# Patient Record
Sex: Male | Born: 1965 | Race: White | Hispanic: No | Marital: Married
Health system: Southern US, Community
[De-identification: ages and names within clinical notes are randomized; demographics above are authoritative.]

---

## 1998-07-06 ENCOUNTER — Encounter: Payer: Self-pay | Admitting: Emergency Medicine

## 1998-07-06 ENCOUNTER — Observation Stay (HOSPITAL_COMMUNITY): Admission: EM | Admit: 1998-07-06 | Discharge: 1998-07-07 | Payer: Self-pay | Admitting: Emergency Medicine

## 2000-03-27 ENCOUNTER — Other Ambulatory Visit: Admission: RE | Admit: 2000-03-27 | Discharge: 2000-03-27 | Payer: Self-pay | Admitting: Urology

## 2000-03-27 ENCOUNTER — Encounter (INDEPENDENT_AMBULATORY_CARE_PROVIDER_SITE_OTHER): Payer: Self-pay | Admitting: Specialist

## 2007-05-25 ENCOUNTER — Ambulatory Visit (HOSPITAL_COMMUNITY): Admission: RE | Admit: 2007-05-25 | Discharge: 2007-05-26 | Payer: Self-pay | Admitting: Specialist

## 2008-01-11 ENCOUNTER — Emergency Department (HOSPITAL_COMMUNITY): Admission: EM | Admit: 2008-01-11 | Discharge: 2008-01-11 | Payer: Self-pay | Admitting: Emergency Medicine

## 2008-04-08 IMAGING — CR DG SHOULDER 2+V*L*
3 series · 3 of 3 positions shown · non-contrast
Comparison: None.

CLINICAL DATA: Left clavicle fracture/nonunion. 
 LEFT SHOULDER ? 3 VIEW:

[w shoulder ap internal left]
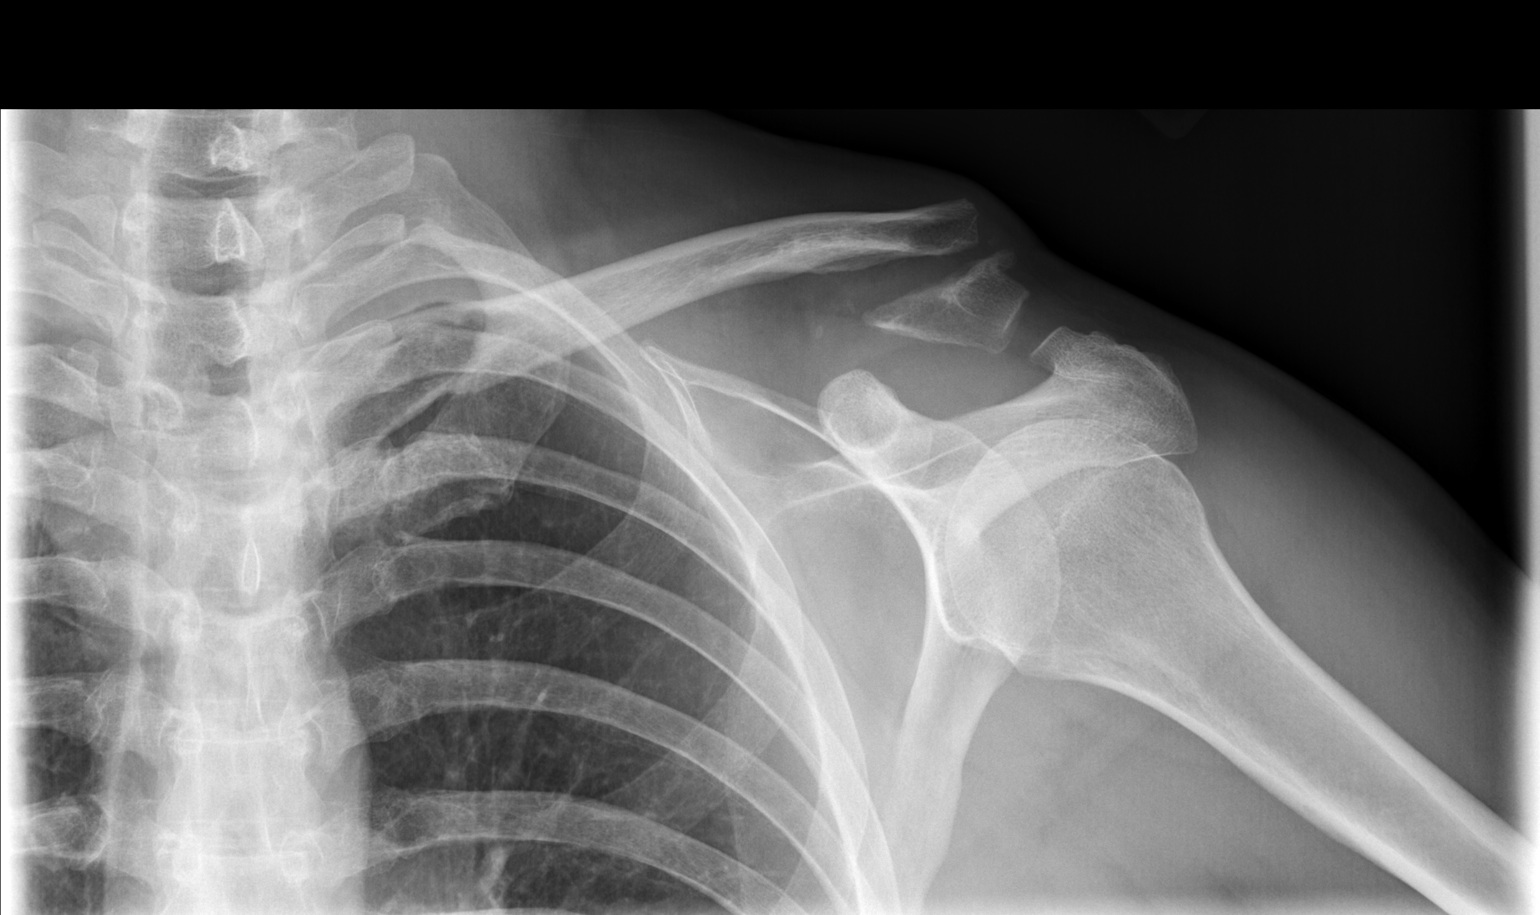

[w shoulder ap external left]
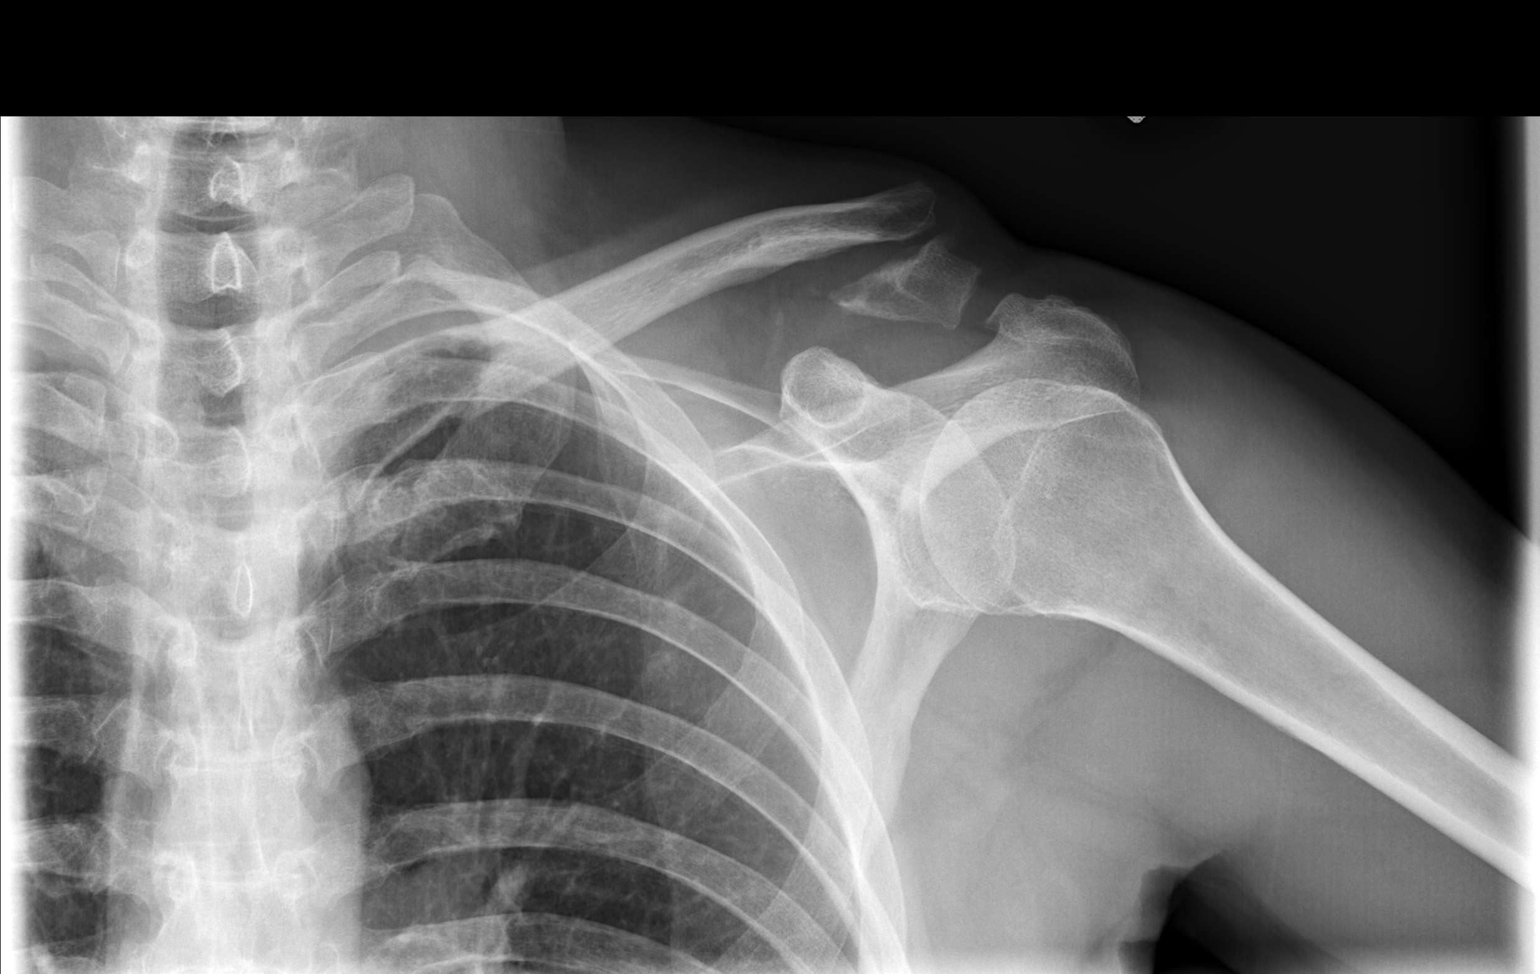

[w shoulder y view left]
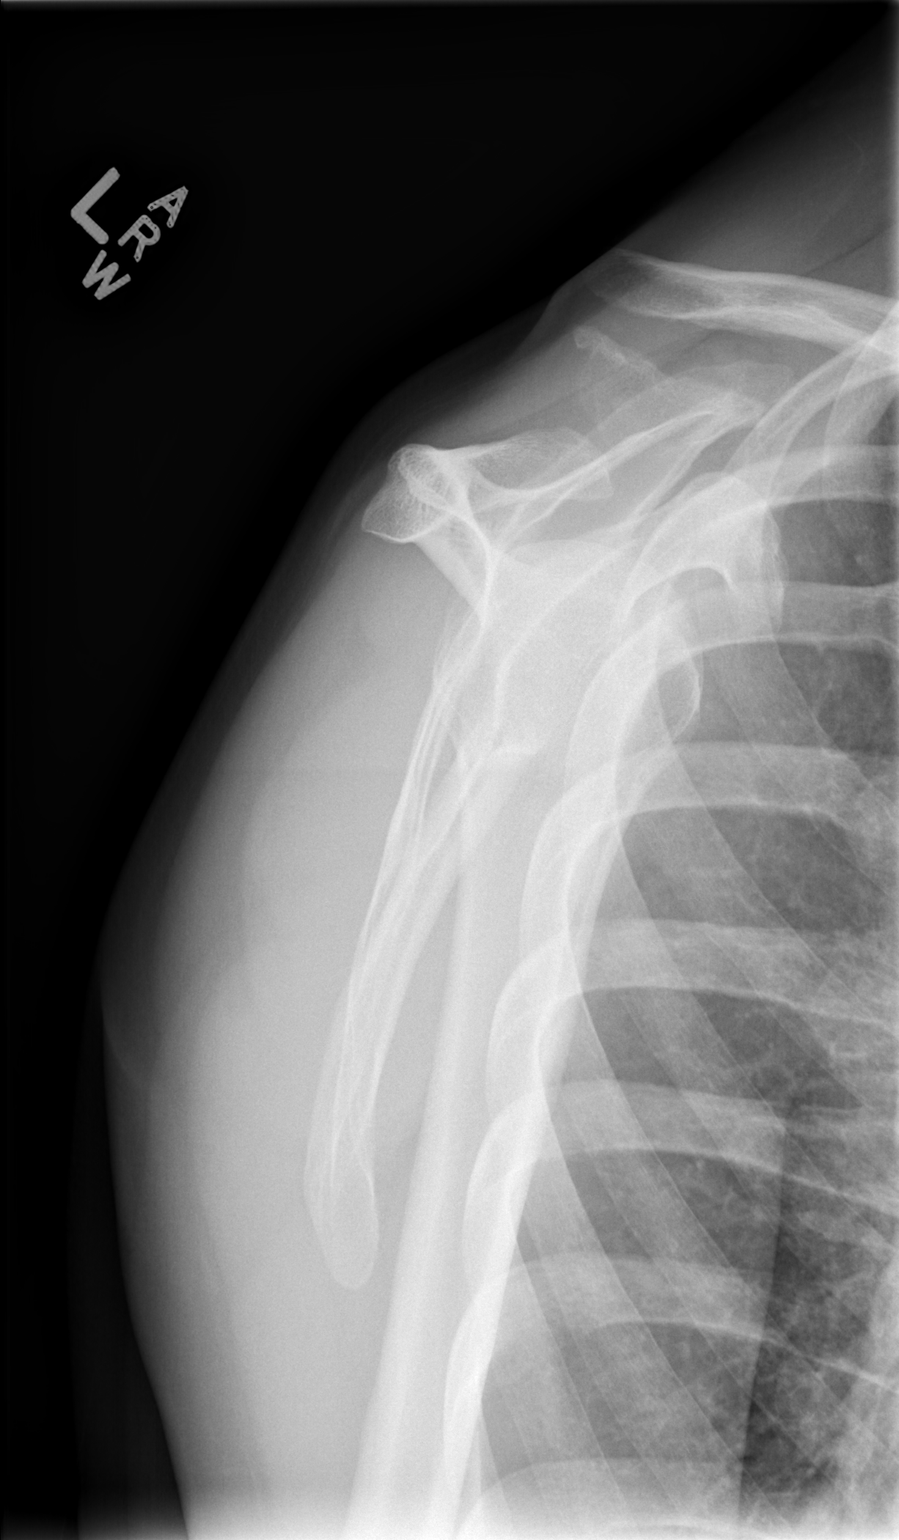

[3 of 3 positions shown; findings below may reference images not displayed]

FINDINGS: There is a fracture of the distal left clavicle associated with 1.9 cm of inferior displacement.  There is also medial displacement.  The acromioclavicular joint appears intact, although is mildly widened.  The acromion and coracoid process are intact.  The degree of displacement implies a tear of the coracoclavicular ligament, and small ossific densities are noted in that location.  No rib fracture is seen.
IMPRESSION: Displaced fracture of the distal clavicle as described.  The fracture margins are somewhat ill defined and this may reflect an old injury with nonunion as per given history.

## 2008-05-09 ENCOUNTER — Ambulatory Visit (HOSPITAL_BASED_OUTPATIENT_CLINIC_OR_DEPARTMENT_OTHER): Admission: RE | Admit: 2008-05-09 | Discharge: 2008-05-09 | Payer: Self-pay | Admitting: Specialist

## 2010-05-09 ENCOUNTER — Encounter: Payer: Self-pay | Admitting: Specialist

## 2010-08-02 LAB — POCT I-STAT 4, (NA,K, GLUC, HGB,HCT)
Glucose, Bld: 94 mg/dL (ref 70–99)
HCT: 45 % (ref 39.0–52.0)
Hemoglobin: 15.3 g/dL (ref 13.0–17.0)
Potassium: 4.5 mEq/L (ref 3.5–5.1)
Sodium: 142 mEq/L (ref 135–145)

## 2010-08-31 NOTE — Op Note (Signed)
Aaron Ponce, Aaron Ponce NO.:  [id number]   MEDICAL RECORD NO.:  [id number]          PATIENT TYPE:  OIB   LOCATION:  1612                         FACILITY:  [facility]   PHYSICIAN:  Jene Every, M.D.    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   PROCEDURE:  1. Takedown of nonunion of distal clavicle fracture.  2. Excision of the distal clavicle.  3. Reduction of the clavicle and repair and reconstruction of the      coracoclavicular ligaments utilizing the suture scrap for autograft      bone grafting of the distal clavicle.   ANESTHESIA:  General.   ASSISTANT:  Almedia Balls. Ranell Patrick, M.D.   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  He had delayed surgical intervention.  He had a  hypermobile distal clavicle and was unstable, and it was thought that  open reduction and internal fixation and possible reconstruction of the  coracoclavicular ligaments and allograft bone grafting would be  required.  The risks and benefits discussed, including bleeding,  infection, damage to neurovascular structures, suboptimal range of  motion, recurrent separation, nonunion, malunion, DVT, PE, anesthetic  complications, etc.   TECHNIQUE:  With the patient in supine beach chair position and after  induction of adequate general anesthesia and 1 gram Kefzol, the left  shoulder and upper extremity was prepped and draped in the usual sterile  fashion.   A saber-type incision was made over the anterior aspect of the distal  left clavicle.  Subcutaneous  tissue dissected.  Electrocautery utilized  to achieve hemostasis.  The deltotrapezial fascia was identified and  divided and subperiosteally elevated from the distal clavicle,  identifying the nonunion.  Fibrous tissue was then debrided from the  area of the nonunion over the distal clavicle.  The fragment over the  distal clavicle was displaced.  It was skeletonized and felt to be  hypermobile and not amenable to surgical fixation.  This was  skeletonized and removed with a rongeur and morselized for later  autografting.  The distal clavicle was found to be hypermobile as well.  There was a fragment of bone that was pulled from the distal clavicle  that had the coracoclavicular ligament attached to it.  We felt that  this would allow for reconstruction and stabilization of the distal  clavicle.  The deltotrapezial fascia was subperiosteally elevated over  the anterior aspect of the clavicle.  We then split that and identified  with digital palpation the coracoid.  The coracoid on top of the  coracoid was identified.  We placed FiberWire sutures through the  coracoclavicular ligament and the fragment of bone that had pulled off  the clavicle.  This was saved  for further reconstruction.  We then used  a drill, and we drilled through the clavicle and the coracoid.  We  passed a suture button through the two out from underneath the inferior  aspect of the coracoid.  Soft tissues beneath that were protected at all  times with a curved Crego.  This wire was then passed, and then the  sutures on the EndoButton were then passed through the clavicle and  through the coracoid.  The button was then inserted on the inferior  aspect of the coracoid, holding it into place.  Excellent fixation was  noted here.  We passed this EndoButton up through the clavicle.  We then  placed the drill holes over the distal clavicle and brought two suture  arms from the coracoclavicular ligament and its fragment of  bone.  We  then reduced the distal clavicle with the EndoButton and tied it a  surgeon's knot on top of it, with excellent reduction and collinear with  the acromion noted.  I then threaded the suture ends of the  coracoclavicular ligaments with the bone through the end of the distal  clavicle after it was prepared.  We decorticated the undersurface of the  clavicle, rongeured that so this bone would be delivered to that portion  of the clavicle with bone-on-bone contact.  This was tied on top with  surgeon's knots.  We had excellent purchase.  We then used autograft  bone graft and bone grafted around the fragment of bone to aid in  ossification of the coracoclavicular ligaments to the clavicle.  Prior  to this, we copiously irrigated with irrigation.  This was performed  under x-ray.  This was found to stabilized the distal clavicle.  We then  repaired the deltotrapezial fascia with #1 Vicryl interrupted figure-of-  eight sutures with the deltoid over top of the clavicle.  Subcutaneous  tissue reapproximated with 2-0 Vicryl.  The skin was reapproximated with  staples.  We felt there was excellent fixation of the distal clavicle  noted.  The final picture revealed that with placement of the  EndoButton.   A sterile dressing was applied.  He was placed in a sling, extubated  without difficulty, and transported to the recovery room in satisfactory  condition.  The patient tolerated the procedure well, and there were no  complications.      Jene Every, M.D.  Electronically Signed     JB/MEDQ  D:  05/25/2007  T:  05/27/2007  Job:  [id number]
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
## 2011-01-07 LAB — URINALYSIS, ROUTINE W REFLEX MICROSCOPIC
Glucose, UA: NEGATIVE
Hgb urine dipstick: NEGATIVE
Ketones, ur: NEGATIVE
Protein, ur: NEGATIVE
Urobilinogen, UA: 0.2

## 2011-01-07 LAB — PROTIME-INR
INR: 0.9
Prothrombin Time: 12.5

## 2011-01-07 LAB — BASIC METABOLIC PANEL
BUN: 11
Calcium: 9.1
Creatinine, Ser: 0.86
GFR calc non Af Amer: >60
Glucose, Bld: 90
Potassium: 4.6

## 2011-01-07 LAB — CBC
HCT: 41.7
Platelets: 232
RDW: 13.7
WBC: 5.9

## 2011-01-07 LAB — DIFFERENTIAL
Basophils Absolute: 0.1
Eosinophils Relative: 9 — ABNORMAL HIGH
Lymphocytes Relative: 45
Neutrophils Relative %: 39 — ABNORMAL LOW

## 2017-03-13 ENCOUNTER — Encounter: Payer: BLUE CROSS/BLUE SHIELD | Admitting: Podiatry

## 2017-03-15 ENCOUNTER — Ambulatory Visit: Payer: BLUE CROSS/BLUE SHIELD | Admitting: Podiatry

## 2017-03-17 NOTE — Progress Notes (Signed)
This encounter was created in error - please disregard.

## 2017-03-27 ENCOUNTER — Ambulatory Visit: Payer: BLUE CROSS/BLUE SHIELD | Admitting: Podiatry

## 2017-04-04 DIAGNOSIS — F5221 Male erectile disorder: Secondary | ICD-10-CM | POA: Diagnosis not present

## 2017-04-04 DIAGNOSIS — Z125 Encounter for screening for malignant neoplasm of prostate: Secondary | ICD-10-CM | POA: Diagnosis not present

## 2017-07-07 DIAGNOSIS — E782 Mixed hyperlipidemia: Secondary | ICD-10-CM | POA: Diagnosis not present

## 2017-07-07 DIAGNOSIS — M62830 Muscle spasm of back: Secondary | ICD-10-CM | POA: Diagnosis not present

## 2017-07-07 DIAGNOSIS — Z79899 Other long term (current) drug therapy: Secondary | ICD-10-CM | POA: Diagnosis not present

## 2017-07-07 DIAGNOSIS — F319 Bipolar disorder, unspecified: Secondary | ICD-10-CM | POA: Diagnosis not present

## 2017-09-19 DIAGNOSIS — L309 Dermatitis, unspecified: Secondary | ICD-10-CM | POA: Diagnosis not present

## 2017-09-22 DIAGNOSIS — B029 Zoster without complications: Secondary | ICD-10-CM | POA: Diagnosis not present

## 2018-02-05 DIAGNOSIS — F319 Bipolar disorder, unspecified: Secondary | ICD-10-CM | POA: Diagnosis not present

## 2018-02-05 DIAGNOSIS — M79671 Pain in right foot: Secondary | ICD-10-CM | POA: Diagnosis not present

## 2018-02-05 DIAGNOSIS — M25522 Pain in left elbow: Secondary | ICD-10-CM | POA: Diagnosis not present

## 2018-02-05 DIAGNOSIS — E782 Mixed hyperlipidemia: Secondary | ICD-10-CM | POA: Diagnosis not present

## 2019-08-13 DIAGNOSIS — Z125 Encounter for screening for malignant neoplasm of prostate: Secondary | ICD-10-CM | POA: Diagnosis not present

## 2019-08-19 DIAGNOSIS — N5201 Erectile dysfunction due to arterial insufficiency: Secondary | ICD-10-CM | POA: Diagnosis not present

## 2020-01-03 DIAGNOSIS — R002 Palpitations: Secondary | ICD-10-CM | POA: Diagnosis not present

## 2020-01-03 DIAGNOSIS — R202 Paresthesia of skin: Secondary | ICD-10-CM | POA: Diagnosis not present

## 2020-01-06 DIAGNOSIS — F319 Bipolar disorder, unspecified: Secondary | ICD-10-CM | POA: Diagnosis not present

## 2020-01-06 DIAGNOSIS — R0789 Other chest pain: Secondary | ICD-10-CM | POA: Diagnosis not present

## 2020-01-06 DIAGNOSIS — E782 Mixed hyperlipidemia: Secondary | ICD-10-CM | POA: Diagnosis not present

## 2020-01-06 DIAGNOSIS — R030 Elevated blood-pressure reading, without diagnosis of hypertension: Secondary | ICD-10-CM | POA: Diagnosis not present

## 2020-02-03 DIAGNOSIS — R7989 Other specified abnormal findings of blood chemistry: Secondary | ICD-10-CM | POA: Diagnosis not present

## 2020-02-03 DIAGNOSIS — R61 Generalized hyperhidrosis: Secondary | ICD-10-CM | POA: Diagnosis not present

## 2020-02-03 DIAGNOSIS — R946 Abnormal results of thyroid function studies: Secondary | ICD-10-CM | POA: Diagnosis not present

## 2020-02-03 DIAGNOSIS — R7401 Elevation of levels of liver transaminase levels: Secondary | ICD-10-CM | POA: Diagnosis not present

## 2020-02-03 DIAGNOSIS — E782 Mixed hyperlipidemia: Secondary | ICD-10-CM | POA: Diagnosis not present

## 2020-02-03 DIAGNOSIS — E039 Hypothyroidism, unspecified: Secondary | ICD-10-CM | POA: Diagnosis not present

## 2020-03-05 DIAGNOSIS — F339 Major depressive disorder, recurrent, unspecified: Secondary | ICD-10-CM | POA: Diagnosis not present

## 2020-03-05 DIAGNOSIS — F1010 Alcohol abuse, uncomplicated: Secondary | ICD-10-CM | POA: Diagnosis not present

## 2020-03-09 DIAGNOSIS — E039 Hypothyroidism, unspecified: Secondary | ICD-10-CM | POA: Diagnosis not present

## 2020-03-20 DIAGNOSIS — F1010 Alcohol abuse, uncomplicated: Secondary | ICD-10-CM | POA: Diagnosis not present

## 2020-03-20 DIAGNOSIS — F339 Major depressive disorder, recurrent, unspecified: Secondary | ICD-10-CM | POA: Diagnosis not present

## 2020-04-03 DIAGNOSIS — F339 Major depressive disorder, recurrent, unspecified: Secondary | ICD-10-CM | POA: Diagnosis not present

## 2020-04-03 DIAGNOSIS — F1010 Alcohol abuse, uncomplicated: Secondary | ICD-10-CM | POA: Diagnosis not present

## 2020-05-04 DIAGNOSIS — F339 Major depressive disorder, recurrent, unspecified: Secondary | ICD-10-CM | POA: Diagnosis not present

## 2020-05-04 DIAGNOSIS — F1010 Alcohol abuse, uncomplicated: Secondary | ICD-10-CM | POA: Diagnosis not present

## 2020-06-09 DIAGNOSIS — F1010 Alcohol abuse, uncomplicated: Secondary | ICD-10-CM | POA: Diagnosis not present

## 2020-06-09 DIAGNOSIS — F339 Major depressive disorder, recurrent, unspecified: Secondary | ICD-10-CM | POA: Diagnosis not present

## 2020-07-09 DIAGNOSIS — F339 Major depressive disorder, recurrent, unspecified: Secondary | ICD-10-CM | POA: Diagnosis not present

## 2020-07-09 DIAGNOSIS — F1010 Alcohol abuse, uncomplicated: Secondary | ICD-10-CM | POA: Diagnosis not present

## 2020-08-20 DIAGNOSIS — F1010 Alcohol abuse, uncomplicated: Secondary | ICD-10-CM | POA: Diagnosis not present

## 2020-08-20 DIAGNOSIS — F339 Major depressive disorder, recurrent, unspecified: Secondary | ICD-10-CM | POA: Diagnosis not present

## 2020-10-29 DIAGNOSIS — Z5181 Encounter for therapeutic drug level monitoring: Secondary | ICD-10-CM | POA: Diagnosis not present

## 2020-10-29 DIAGNOSIS — F319 Bipolar disorder, unspecified: Secondary | ICD-10-CM | POA: Diagnosis not present

## 2020-10-29 DIAGNOSIS — R5383 Other fatigue: Secondary | ICD-10-CM | POA: Diagnosis not present

## 2020-10-29 DIAGNOSIS — E782 Mixed hyperlipidemia: Secondary | ICD-10-CM | POA: Diagnosis not present

## 2020-10-29 DIAGNOSIS — E039 Hypothyroidism, unspecified: Secondary | ICD-10-CM | POA: Diagnosis not present

## 2020-10-29 DIAGNOSIS — Z20822 Contact with and (suspected) exposure to covid-19: Secondary | ICD-10-CM | POA: Diagnosis not present

## 2020-10-29 DIAGNOSIS — R197 Diarrhea, unspecified: Secondary | ICD-10-CM | POA: Diagnosis not present

## 2020-10-29 DIAGNOSIS — R112 Nausea with vomiting, unspecified: Secondary | ICD-10-CM | POA: Diagnosis not present

## 2020-11-19 DIAGNOSIS — M65321 Trigger finger, right index finger: Secondary | ICD-10-CM | POA: Diagnosis not present

## 2020-11-19 DIAGNOSIS — M1811 Unilateral primary osteoarthritis of first carpometacarpal joint, right hand: Secondary | ICD-10-CM | POA: Diagnosis not present

## 2020-11-19 DIAGNOSIS — M24041 Loose body in right finger joint(s): Secondary | ICD-10-CM | POA: Diagnosis not present

## 2020-11-19 DIAGNOSIS — G7080 Lambert-Eaton syndrome, unspecified: Secondary | ICD-10-CM | POA: Diagnosis not present

## 2020-12-28 DIAGNOSIS — F1010 Alcohol abuse, uncomplicated: Secondary | ICD-10-CM | POA: Diagnosis not present

## 2020-12-28 DIAGNOSIS — F339 Major depressive disorder, recurrent, unspecified: Secondary | ICD-10-CM | POA: Diagnosis not present

## 2021-01-01 DIAGNOSIS — R42 Dizziness and giddiness: Secondary | ICD-10-CM | POA: Diagnosis not present

## 2021-01-01 DIAGNOSIS — E039 Hypothyroidism, unspecified: Secondary | ICD-10-CM | POA: Diagnosis not present

## 2021-01-22 DIAGNOSIS — F4321 Adjustment disorder with depressed mood: Secondary | ICD-10-CM | POA: Diagnosis not present

## 2021-02-12 DIAGNOSIS — F4321 Adjustment disorder with depressed mood: Secondary | ICD-10-CM | POA: Diagnosis not present

## 2021-02-26 DIAGNOSIS — F4321 Adjustment disorder with depressed mood: Secondary | ICD-10-CM | POA: Diagnosis not present

## 2021-03-22 DIAGNOSIS — F4321 Adjustment disorder with depressed mood: Secondary | ICD-10-CM | POA: Diagnosis not present

## 2021-04-05 DIAGNOSIS — F4321 Adjustment disorder with depressed mood: Secondary | ICD-10-CM | POA: Diagnosis not present

## 2021-04-26 DIAGNOSIS — F4321 Adjustment disorder with depressed mood: Secondary | ICD-10-CM | POA: Diagnosis not present

## 2021-05-06 DIAGNOSIS — M65321 Trigger finger, right index finger: Secondary | ICD-10-CM | POA: Diagnosis not present

## 2021-06-04 DIAGNOSIS — F4321 Adjustment disorder with depressed mood: Secondary | ICD-10-CM | POA: Diagnosis not present

## 2021-06-18 DIAGNOSIS — F4321 Adjustment disorder with depressed mood: Secondary | ICD-10-CM | POA: Diagnosis not present

## 2021-06-21 DIAGNOSIS — F319 Bipolar disorder, unspecified: Secondary | ICD-10-CM | POA: Diagnosis not present

## 2021-06-21 DIAGNOSIS — E039 Hypothyroidism, unspecified: Secondary | ICD-10-CM | POA: Diagnosis not present

## 2021-06-21 DIAGNOSIS — M79671 Pain in right foot: Secondary | ICD-10-CM | POA: Diagnosis not present

## 2021-06-21 DIAGNOSIS — E782 Mixed hyperlipidemia: Secondary | ICD-10-CM | POA: Diagnosis not present

## 2021-06-22 ENCOUNTER — Other Ambulatory Visit: Payer: Self-pay | Admitting: Family Medicine

## 2021-06-22 ENCOUNTER — Ambulatory Visit
Admission: RE | Admit: 2021-06-22 | Discharge: 2021-06-22 | Disposition: A | Discharge status: Home / Self Care | Payer: BC Managed Care – PPO | Source: Ambulatory Visit | Attending: Family Medicine | Admitting: Family Medicine

## 2021-06-22 DIAGNOSIS — M79671 Pain in right foot: Secondary | ICD-10-CM

## 2021-06-22 DIAGNOSIS — M7989 Other specified soft tissue disorders: Secondary | ICD-10-CM | POA: Diagnosis not present

## 2021-07-05 DIAGNOSIS — F4321 Adjustment disorder with depressed mood: Secondary | ICD-10-CM | POA: Diagnosis not present

## 2021-07-15 DIAGNOSIS — Z20822 Contact with and (suspected) exposure to covid-19: Secondary | ICD-10-CM | POA: Diagnosis not present

## 2021-07-15 DIAGNOSIS — E039 Hypothyroidism, unspecified: Secondary | ICD-10-CM | POA: Diagnosis not present

## 2021-07-15 DIAGNOSIS — R110 Nausea: Secondary | ICD-10-CM | POA: Diagnosis not present

## 2021-07-15 DIAGNOSIS — R42 Dizziness and giddiness: Secondary | ICD-10-CM | POA: Diagnosis not present

## 2021-07-15 DIAGNOSIS — F319 Bipolar disorder, unspecified: Secondary | ICD-10-CM | POA: Diagnosis not present

## 2021-07-15 DIAGNOSIS — R197 Diarrhea, unspecified: Secondary | ICD-10-CM | POA: Diagnosis not present

## 2021-10-14 DIAGNOSIS — H938X2 Other specified disorders of left ear: Secondary | ICD-10-CM | POA: Diagnosis not present

## 2022-02-14 DIAGNOSIS — E782 Mixed hyperlipidemia: Secondary | ICD-10-CM | POA: Diagnosis not present

## 2022-02-14 DIAGNOSIS — Z0000 Encounter for general adult medical examination without abnormal findings: Secondary | ICD-10-CM | POA: Diagnosis not present

## 2022-02-14 DIAGNOSIS — Z125 Encounter for screening for malignant neoplasm of prostate: Secondary | ICD-10-CM | POA: Diagnosis not present

## 2022-02-14 DIAGNOSIS — E039 Hypothyroidism, unspecified: Secondary | ICD-10-CM | POA: Diagnosis not present

## 2022-02-16 DIAGNOSIS — F3132 Bipolar disorder, current episode depressed, moderate: Secondary | ICD-10-CM | POA: Diagnosis not present

## 2022-02-16 DIAGNOSIS — F3181 Bipolar II disorder: Secondary | ICD-10-CM | POA: Diagnosis not present

## 2022-03-03 DIAGNOSIS — R945 Abnormal results of liver function studies: Secondary | ICD-10-CM | POA: Diagnosis not present

## 2022-03-03 DIAGNOSIS — R748 Abnormal levels of other serum enzymes: Secondary | ICD-10-CM | POA: Diagnosis not present

## 2022-05-07 IMAGING — CR DG FOOT COMPLETE 3+V*R*
3 series · 3 of 3 positions shown · non-contrast
Comparison: No prior.

CLINICAL DATA: Right foot pain.  Soft tissue swelling.

EXAM:
RIGHT FOOT COMPLETE - 3+ VIEW

[x foot ap right]
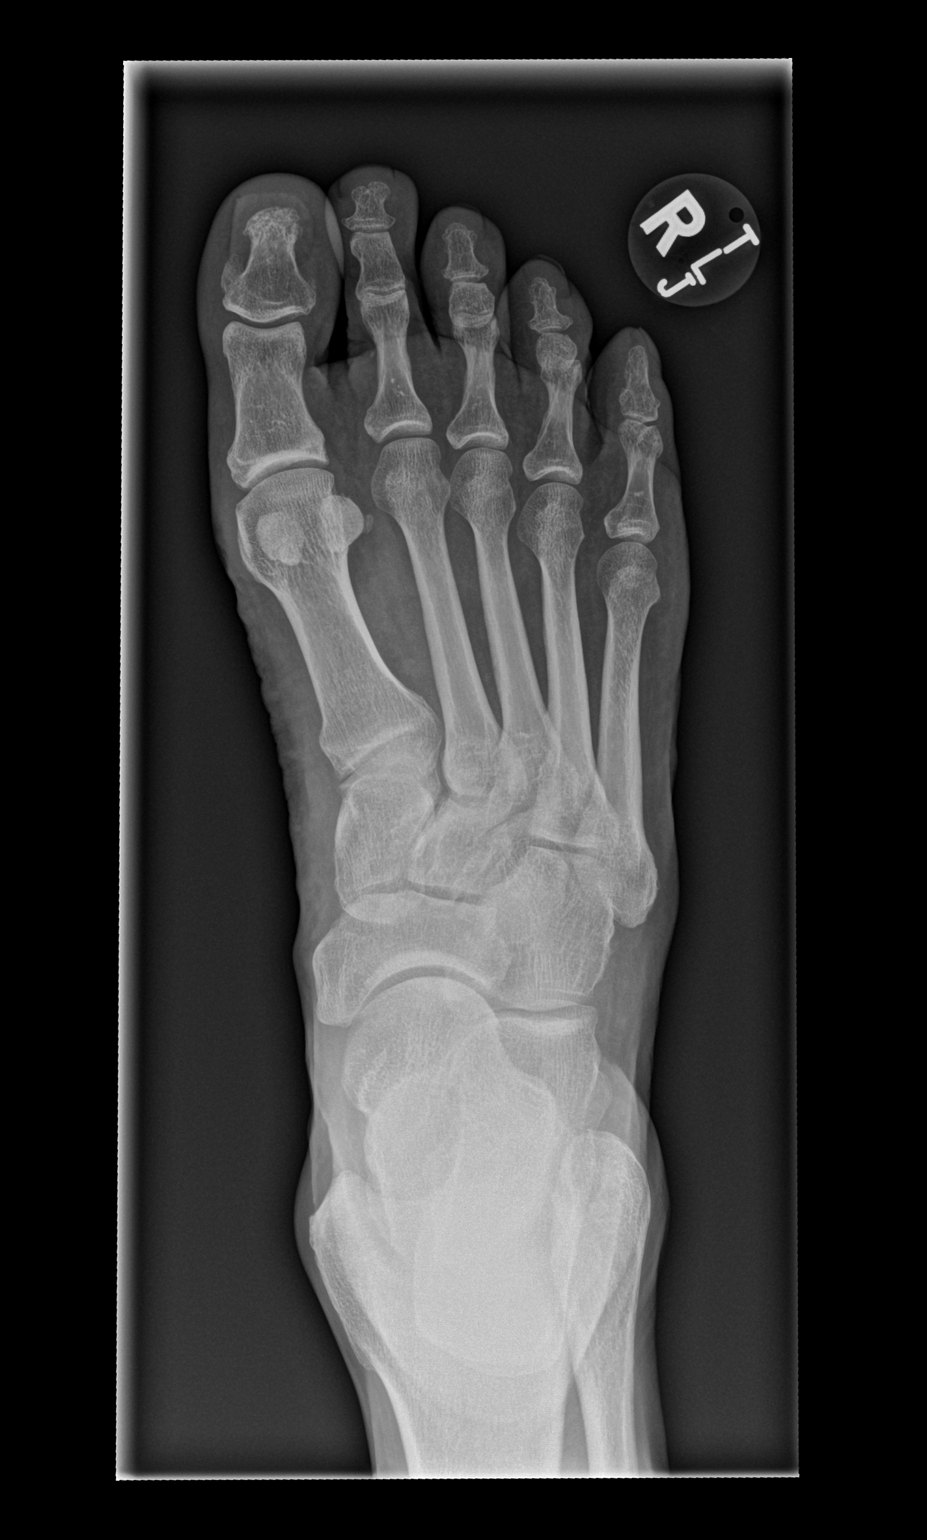

[x foot obl right]
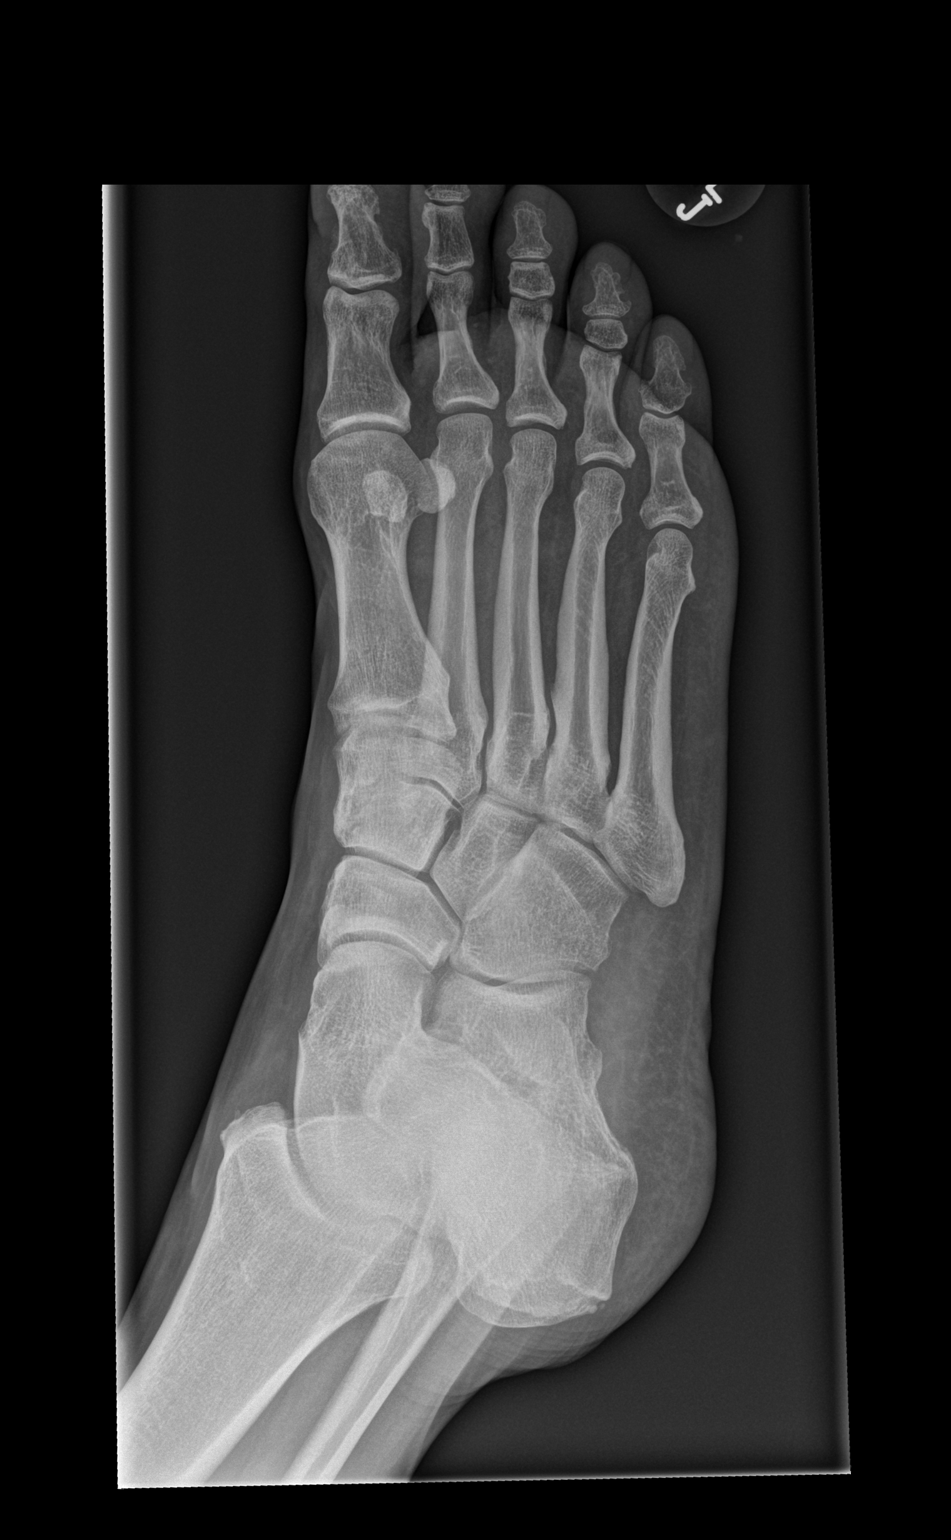

[x foot lat right]
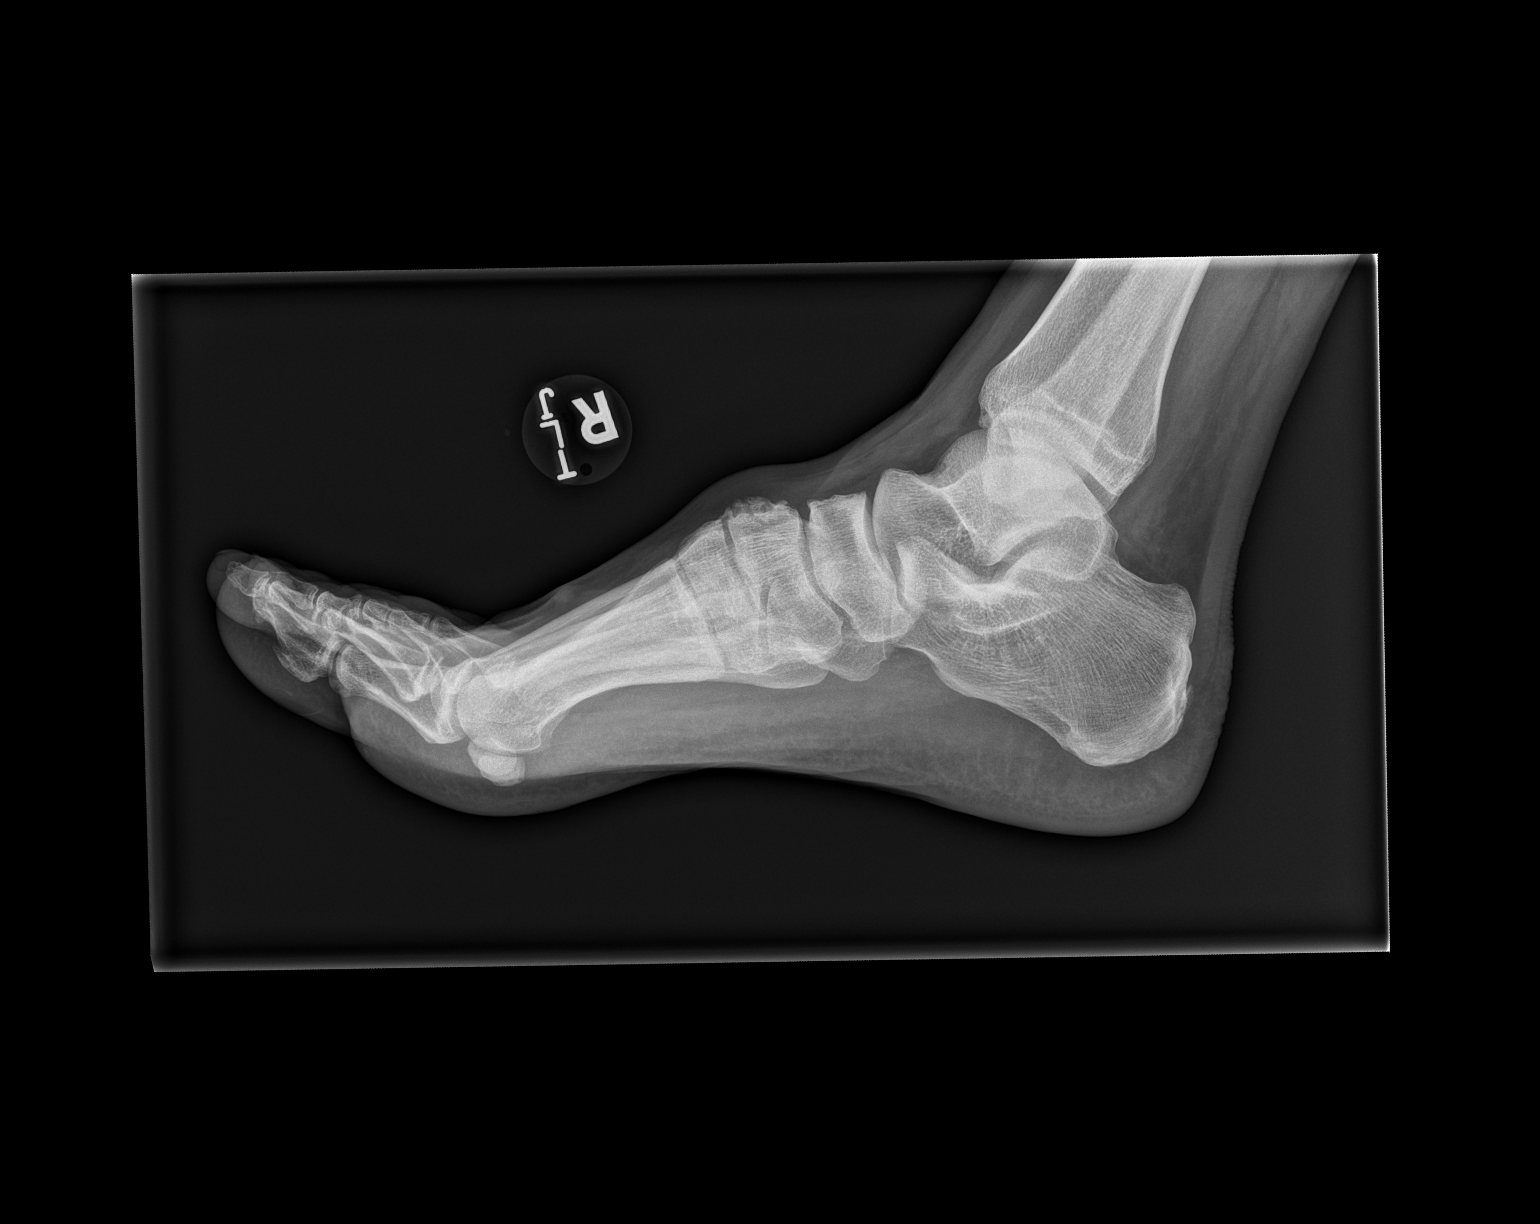

[3 of 3 positions shown; findings below may reference images not displayed]

FINDINGS: Diffuse mild degenerative change. Degenerative changes most prior
about the first MTP joint. No acute bony or joint abnormality. No
evidence of fracture or dislocation. No radiopaque foreign body.
IMPRESSION: Diffuse mild degenerative change, most prominent about the first MTP
joint. No acute abnormality identified.

## 2022-07-07 DIAGNOSIS — M65321 Trigger finger, right index finger: Secondary | ICD-10-CM | POA: Diagnosis not present

## 2022-08-29 DIAGNOSIS — F319 Bipolar disorder, unspecified: Secondary | ICD-10-CM | POA: Diagnosis not present

## 2022-08-29 DIAGNOSIS — E039 Hypothyroidism, unspecified: Secondary | ICD-10-CM | POA: Diagnosis not present

## 2022-08-29 DIAGNOSIS — E782 Mixed hyperlipidemia: Secondary | ICD-10-CM | POA: Diagnosis not present

## 2022-08-29 DIAGNOSIS — I10 Essential (primary) hypertension: Secondary | ICD-10-CM | POA: Diagnosis not present

## 2023-11-03 ENCOUNTER — Other Ambulatory Visit: Payer: Self-pay | Admitting: Family Medicine

## 2023-11-03 DIAGNOSIS — F17200 Nicotine dependence, unspecified, uncomplicated: Secondary | ICD-10-CM

## 2023-11-03 DIAGNOSIS — R634 Abnormal weight loss: Secondary | ICD-10-CM

## 2023-11-13 ENCOUNTER — Ambulatory Visit
Admission: RE | Admit: 2023-11-13 | Discharge: 2023-11-13 | Disposition: A | Discharge status: Home / Self Care | Source: Ambulatory Visit | Attending: Family Medicine | Admitting: Family Medicine

## 2023-11-13 DIAGNOSIS — F17200 Nicotine dependence, unspecified, uncomplicated: Secondary | ICD-10-CM

## 2023-11-13 DIAGNOSIS — R634 Abnormal weight loss: Secondary | ICD-10-CM

## 2023-11-13 MED ORDER — IOPAMIDOL (ISOVUE-370) INJECTION 76%
80.0000 mL | Freq: Once | INTRAVENOUS | Status: AC | PRN
  Administered 2023-11-13: 80 mL via INTRAVENOUS
